# Patient Record
Sex: Male | Born: 1985 | Race: White | Hispanic: No | Marital: Single | Smoking: Never smoker
Health system: Southern US, Community
[De-identification: ages and names within clinical notes are randomized; demographics above are authoritative.]

---

## 2017-08-22 ENCOUNTER — Emergency Department (HOSPITAL_COMMUNITY)
Admission: EM | Admit: 2017-08-22 | Discharge: 2017-08-22 | Disposition: A | Discharge status: Home / Self Care | Payer: Worker's Compensation | Attending: Emergency Medicine | Admitting: Emergency Medicine

## 2017-08-22 ENCOUNTER — Emergency Department (HOSPITAL_COMMUNITY): Payer: Worker's Compensation

## 2017-08-22 ENCOUNTER — Encounter (HOSPITAL_COMMUNITY): Payer: Self-pay | Admitting: Emergency Medicine

## 2017-08-22 ENCOUNTER — Other Ambulatory Visit: Payer: Self-pay

## 2017-08-22 DIAGNOSIS — Y990 Civilian activity done for income or pay: Secondary | ICD-10-CM | POA: Insufficient documentation

## 2017-08-22 DIAGNOSIS — S61512A Laceration without foreign body of left wrist, initial encounter: Secondary | ICD-10-CM

## 2017-08-22 DIAGNOSIS — Z23 Encounter for immunization: Secondary | ICD-10-CM | POA: Diagnosis not present

## 2017-08-22 DIAGNOSIS — Y929 Unspecified place or not applicable: Secondary | ICD-10-CM | POA: Diagnosis not present

## 2017-08-22 DIAGNOSIS — Y939 Activity, unspecified: Secondary | ICD-10-CM | POA: Insufficient documentation

## 2017-08-22 DIAGNOSIS — W25XXXA Contact with sharp glass, initial encounter: Secondary | ICD-10-CM | POA: Diagnosis not present

## 2017-08-22 MED ORDER — TETANUS-DIPHTH-ACELL PERTUSSIS 5-2.5-18.5 LF-MCG/0.5 IM SUSP
0.5000 mL | Freq: Once | INTRAMUSCULAR | Status: AC
  Administered 2017-08-22: 0.5 mL via INTRAMUSCULAR
  Filled 2017-08-22: qty 0.5

## 2017-08-22 MED ORDER — LIDOCAINE-EPINEPHRINE (PF) 2 %-1:200000 IJ SOLN
20.0000 mL | Freq: Once | INTRAMUSCULAR | Status: AC
  Administered 2017-08-22: 20 mL
  Filled 2017-08-22: qty 20

## 2017-08-22 NOTE — ED Provider Notes (Signed)
[facility]   CSN: [id number] Arrival date & time: 08/22/17  1041     History   Chief Complaint Chief Complaint  Patient presents with  . Extremity Laceration  . Wrist Pain    HPI Alex Armstrong is a 32 y.o. male.  HPI   32 year old male presents today with laceration to his left wrist. Patient reports he was at work when he cut his wrist on glass. Patient notes bleeding was easily controlled. He denies any loss of distal sensation strength or motor function of the wrist hand or fingers. Patient is uncertain of when his last tetanus was. No other injuries noted.   History reviewed. No pertinent past medical history.  There are no active problems to display for this patient.   History reviewed. No pertinent surgical history.      Home Medications    Prior to Admission medications   Not on File    Family History No family history on file.  Social History Social History   Tobacco Use  . Smoking status: Never Smoker  . Smokeless tobacco: Never Used  Substance Use Topics  . Alcohol use: Yes  . Drug use: Not Currently     Allergies   Patient has no allergy information on record.   Review of Systems Review of Systems  All other systems reviewed and are negative.   Physical Exam Updated Vital Signs BP (!) 118/94   Pulse 85   Temp 98.2 F (36.8 C) (Oral)   Resp 16   Ht 5\' 8"  (1.727 m)   Wt 113.4 kg (250 lb)   SpO2 97%   BMI 38.01 kg/m   Physical Exam  Constitutional: He is oriented to person, place, and time. He appears well-developed and well-nourished.  HENT:  Head: Normocephalic and atraumatic.  Eyes: Pupils are equal, round, and reactive to light. Conjunctivae are normal. Right eye exhibits no discharge. Left eye exhibits no discharge. No scleral icterus.  Neck: Normal range of motion. No JVD present. No tracheal deviation present.  Pulmonary/Chest: Effort normal. No stridor.    Musculoskeletal:  4 cm laceration over the proximal wall or wrist of the left extremity, no deep space involvement, full active range of motion of the wrist hand and fingers, distal sensation intact  Neurological: He is alert and oriented to person, place, and time. Coordination normal.  Psychiatric: He has a normal mood and affect. His behavior is normal. Judgment and thought content normal.  Nursing note and vitals reviewed.    ED Treatments / Results  Labs (all labs ordered are listed, but only abnormal results are displayed) Labs Reviewed - No data to display  EKG None  Radiology Dg Wrist Complete Right  Result Date: 08/22/2017 CLINICAL DATA:  Cut wrist with glass, injury by a glass window today EXAM: RIGHT WRIST - COMPLETE 3+ VIEW COMPARISON:  None FINDINGS: Osseous mineralization normal. Joint spaces preserved. No acute fracture, dislocation, or bone destruction. Soft tissue irregularity/laceration at the volar aspect of the RIGHT wrist. No radiopaque foreign bodies identified. IMPRESSION: No acute osseous abnormalities or radiopaque foreign bodies. Electronically Signed   By: Ulyses SouthwardMark  Boles M.D.   On: 08/22/2017 11:39    Procedures .Marland Kitchen.Laceration Repair Date/Time: 08/22/2017 3:51 PM Performed by: Eyvonne MechanicHedges, Jetaime Pinnix, PA-C Authorized by: Eyvonne MechanicHedges, Parthenia Tellefsen, PA-C   Consent:    Consent obtained:  Verbal   Consent given by:  Patient   Risks discussed:  Infection, pain, tendon damage, vascular damage, poor wound healing,  poor cosmetic result, need for additional repair and nerve damage   Alternatives discussed:  No treatment Anesthesia (see MAR for exact dosages):    Anesthesia method:  Local infiltration   Local anesthetic:  Lidocaine 2% WITH epi Laceration details:    Location: left wrist.   Length (cm):  4 Repair type:    Repair type:  Simple Pre-procedure details:    Preparation:  Patient was prepped and draped in usual sterile fashion Exploration:    Hemostasis achieved with:   Direct pressure   Wound exploration: wound explored through full range of motion and entire depth of wound probed and visualized     Wound extent: no areolar tissue violation noted, no fascia violation noted, no foreign bodies/material noted, no muscle damage noted, no nerve damage noted, no tendon damage noted, no underlying fracture noted and no vascular damage noted     Contaminated: no   Treatment:    Area cleansed with:  Saline   Amount of cleaning:  Standard   Irrigation solution:  Sterile saline   Visualized foreign bodies/material removed: no   Skin repair:    Repair method:  Sutures   Suture size:  5-0   Suture material:  Fast-absorbing gut   Number of sutures:  9 Approximation:    Approximation:  Close Post-procedure details:    Dressing:  Non-adherent dressing and antibiotic ointment   Patient tolerance of procedure:  Tolerated well, no immediate complications   (including critical care time)  Medications Ordered in ED Medications  lidocaine-EPINEPHrine (XYLOCAINE W/EPI) 2 %-1:200000 (PF) injection 20 mL (20 mLs Infiltration Given 08/22/17 1334)  Tdap (BOOSTRIX) injection 0.5 mL (0.5 mLs Intramuscular Given 08/22/17 1431)     Initial Impression / Assessment and Plan / ED Course  I have reviewed the triage vital signs and the nursing notes.  Pertinent labs & imaging results that were available during my care of the patient were reviewed by me and considered in my medical decision making (see chart for details).    Labs:   Imaging: dg wrist  Consults:  Therapeutics:lidocaine, T dap  Discharge Meds:   Assessment/Plan:   32 year old male presents today with laceration to his wrist. He has no deep space involvement, this did not  Stent past the fatty tissue. No comp getting features, cleansed here repaired. Patient placed in splint, encouraged to avoid any heavy lifting, return merely with any new or worsening signs or symptoms. Patient verbalized understanding and  agreement today's plan had no further questions concerns.  Final Clinical Impressions(s) / ED Diagnoses   Final diagnoses:  Laceration of left wrist, initial encounter    ED Discharge Orders    None       Eyvonne Mechanic, PA-C 08/22/17 1553    Shaune Pollack, MD 08/23/17 [phone]

## 2017-08-22 NOTE — Discharge Instructions (Addendum)
Please read attached information. If you experience any new or worsening signs or symptoms please return to the emergency room for evaluation. Please follow-up with your primary care provider or specialist as discussed. Please wear wrist brace for the next 5-7 days, avoid any heavy lifting with the left upper extremity until wound is healed.

## 2017-08-22 NOTE — ED Triage Notes (Signed)
Pt. Stated, I was at work and had a PIECE OF GLASS TO FALL ON MY left wrist 2-3 inches wide and deep.

## 2019-05-11 IMAGING — DX DG WRIST COMPLETE 3+V*R*
4 series · 4 of 4 positions shown · non-contrast
Comparison: None

CLINICAL DATA: Cut wrist with glass, injury by a glass window today

EXAM:
RIGHT WRIST - COMPLETE 3+ VIEW

[x wrist pa right]
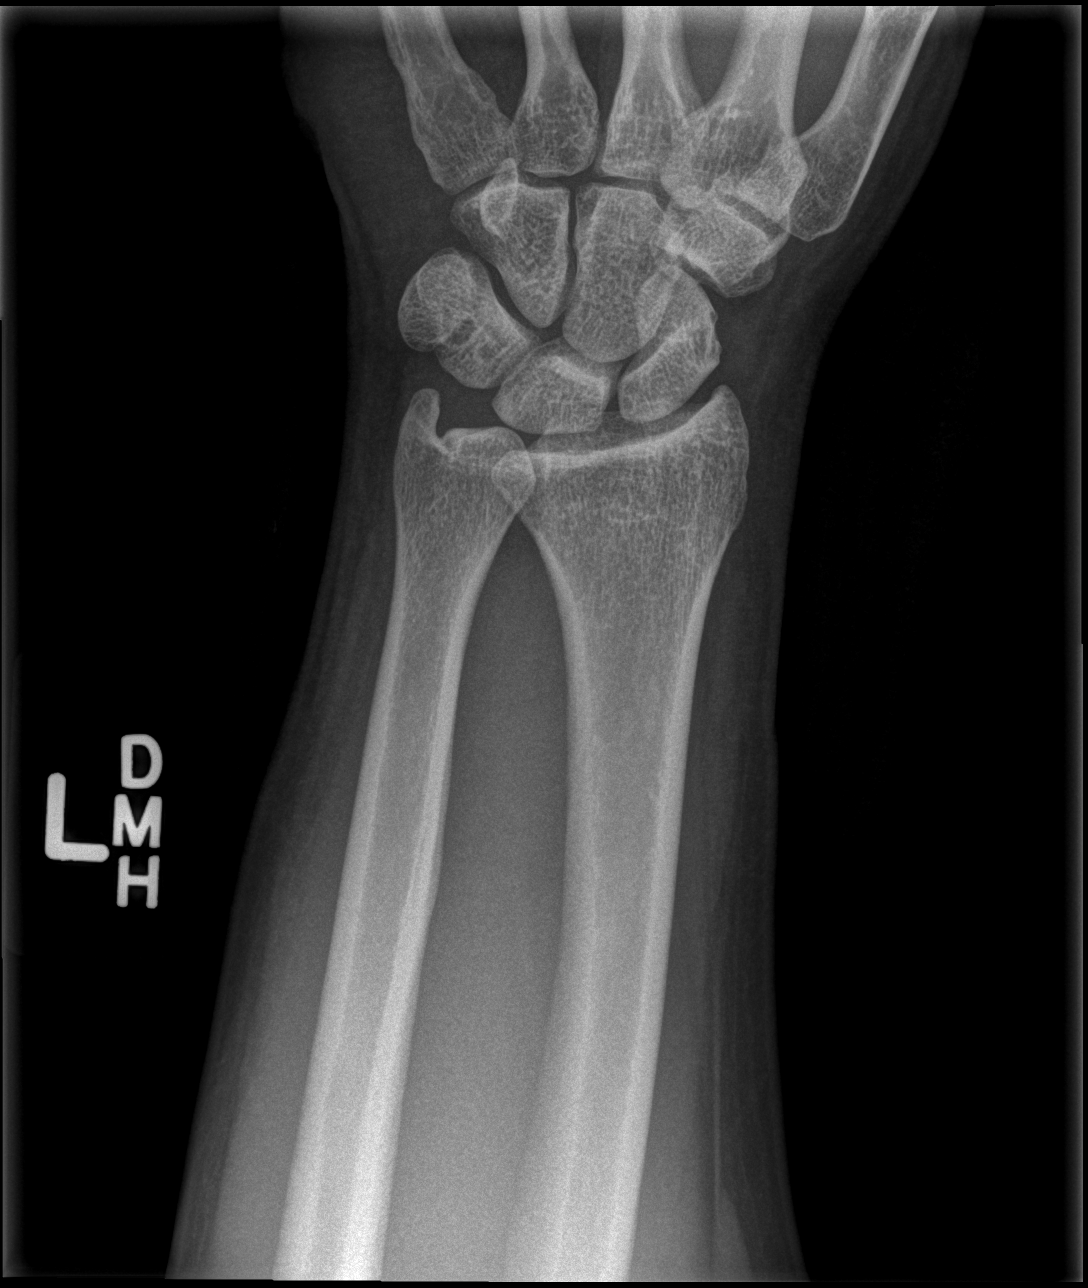

[x wrist obl right]
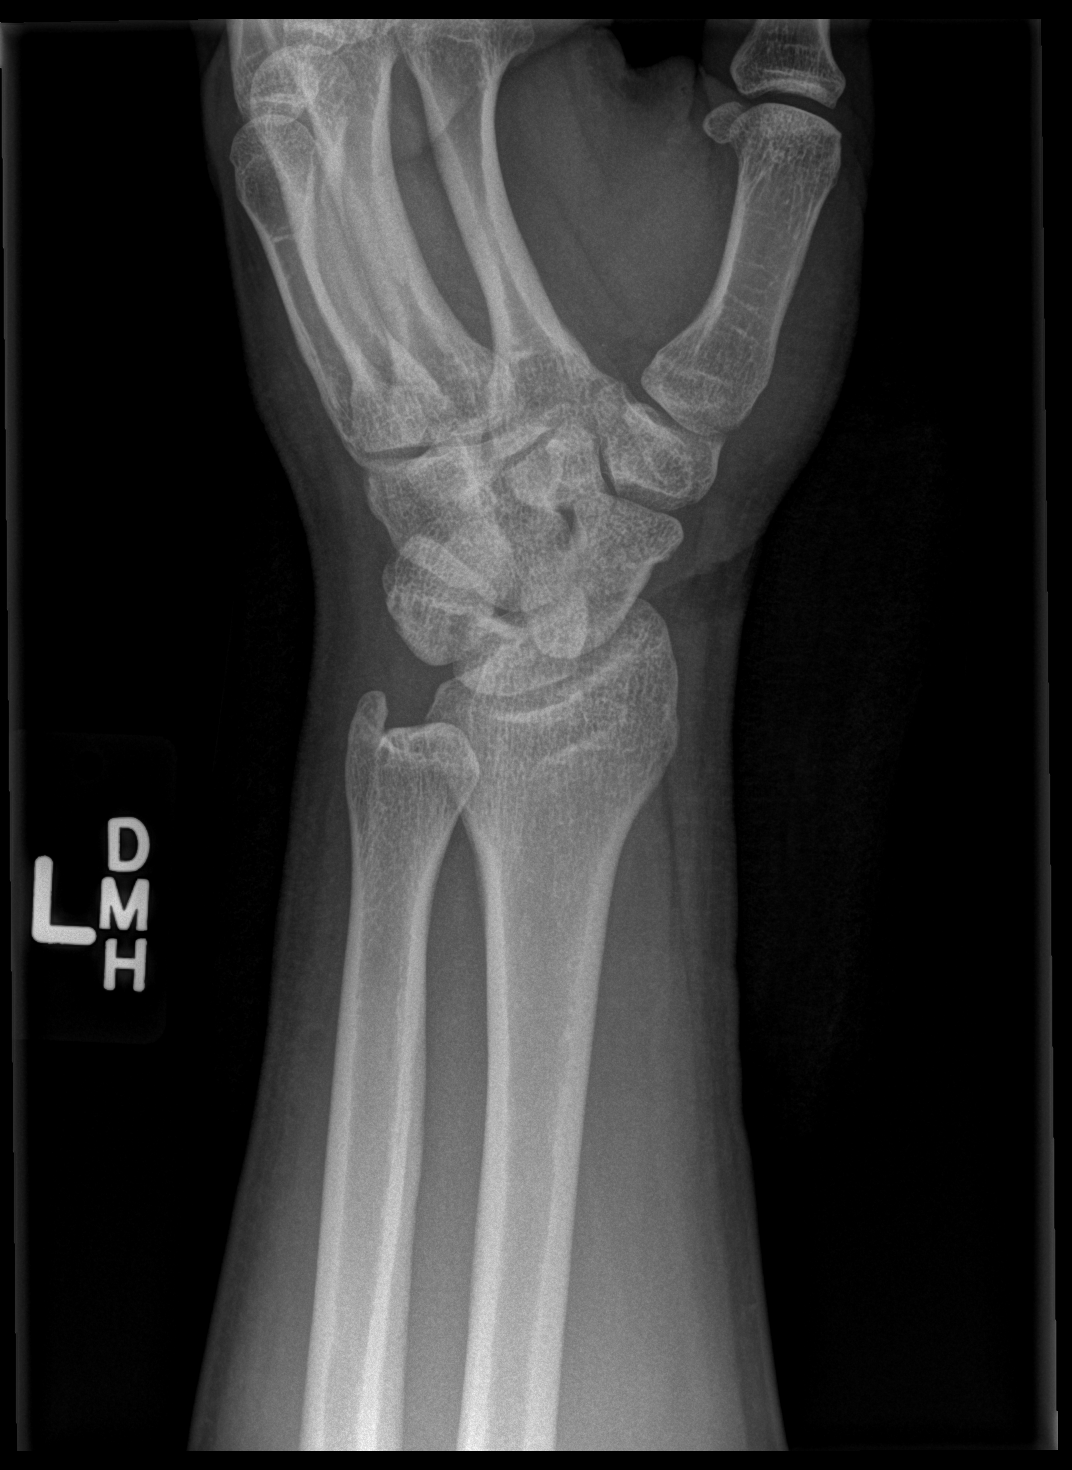

[x wrist lat right]
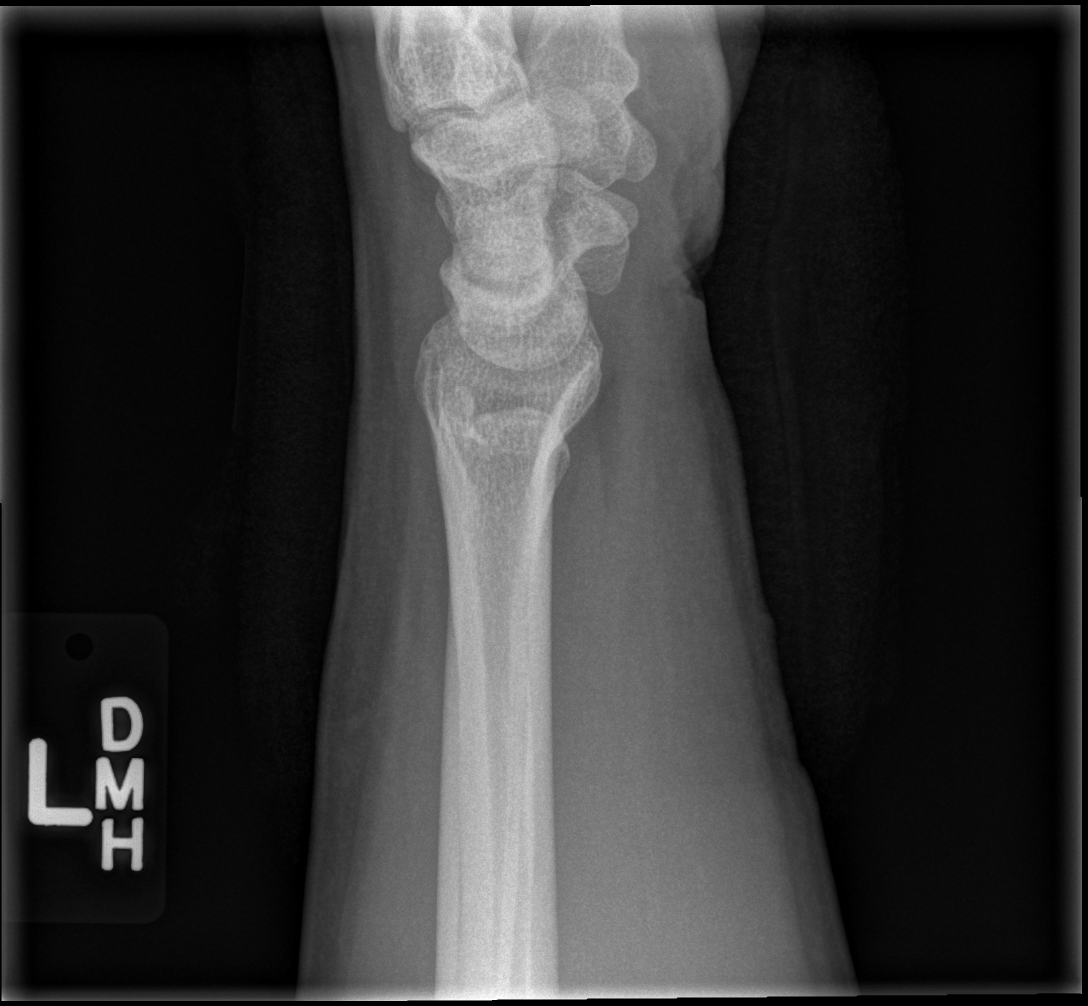

[x wrist navicular view right]
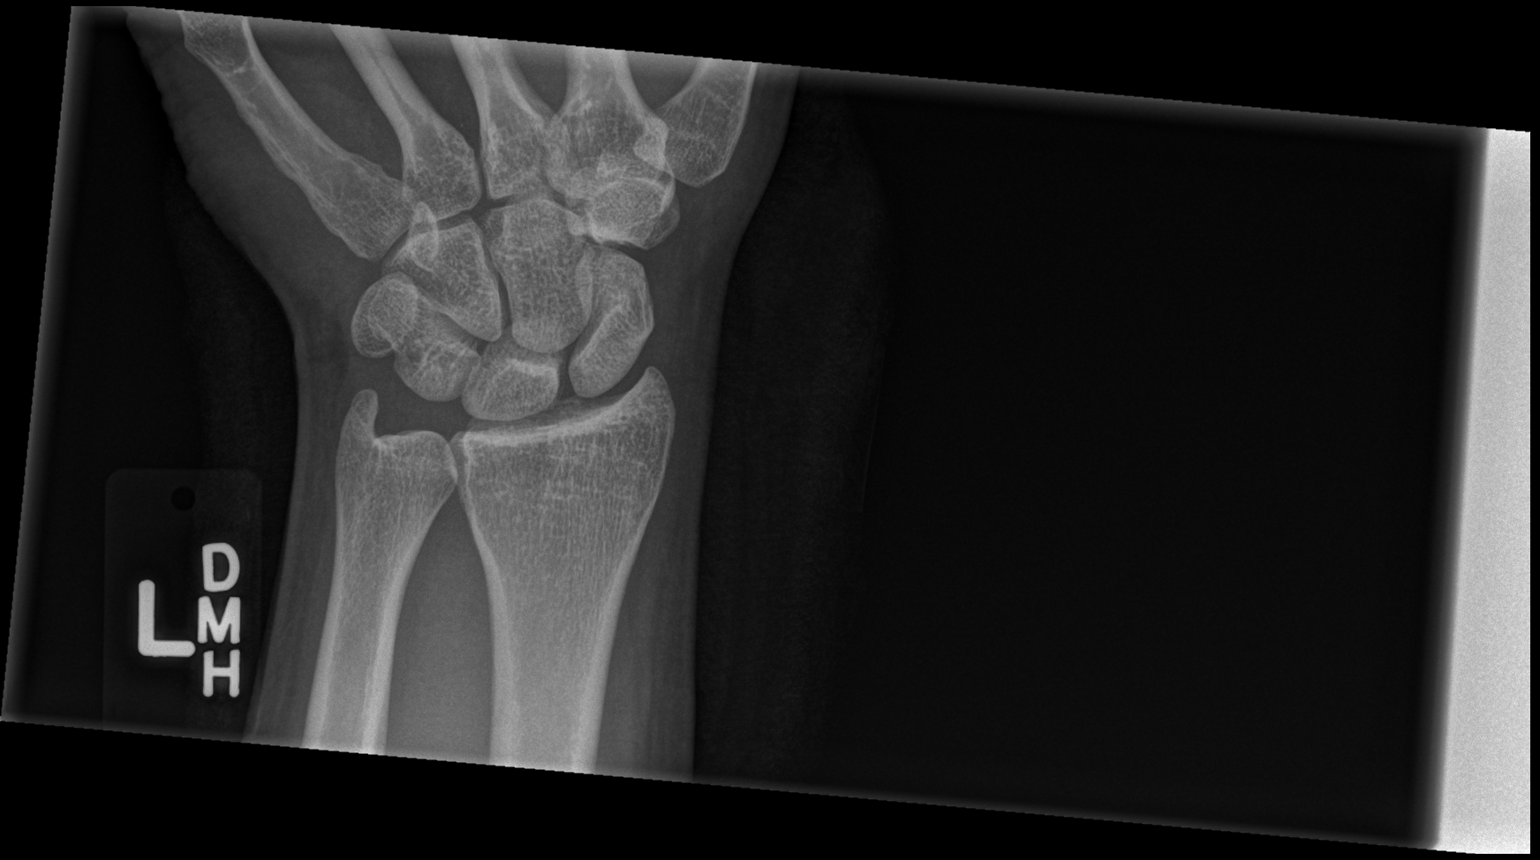

[4 of 4 positions shown; findings below may reference images not displayed]

FINDINGS: Osseous mineralization normal.

Joint spaces preserved.

No acute fracture, dislocation, or bone destruction.

Soft tissue irregularity/laceration at the volar aspect of the RIGHT
wrist.

No radiopaque foreign bodies identified.
IMPRESSION: No acute osseous abnormalities or radiopaque foreign bodies.
